# Patient Record
Sex: Male | Born: 1990 | Race: White | Hispanic: No | Marital: Single | Smoking: Former smoker
Health system: Southern US, Community
[De-identification: ages and names within clinical notes are randomized; demographics above are authoritative.]

## PROBLEM LIST (undated history)

## (undated) DIAGNOSIS — F32A Depression, unspecified: Secondary | ICD-10-CM

## (undated) DIAGNOSIS — F419 Anxiety disorder, unspecified: Secondary | ICD-10-CM

## (undated) DIAGNOSIS — F329 Major depressive disorder, single episode, unspecified: Secondary | ICD-10-CM

## (undated) DIAGNOSIS — F1910 Other psychoactive substance abuse, uncomplicated: Secondary | ICD-10-CM

## (undated) HISTORY — DX: Major depressive disorder, single episode, unspecified: F32.9

## (undated) HISTORY — DX: Anxiety disorder, unspecified: F41.9

## (undated) HISTORY — DX: Other psychoactive substance abuse, uncomplicated: F19.10

## (undated) HISTORY — DX: Depression, unspecified: F32.A

---
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
## 2010-05-30 ENCOUNTER — Emergency Department (HOSPITAL_COMMUNITY)
Admission: EM | Admit: 2010-05-30 | Discharge: 2010-05-30 | Payer: Self-pay | Source: Home / Self Care | Admitting: Family Medicine

## 2010-09-11 ENCOUNTER — Emergency Department (HOSPITAL_COMMUNITY): Payer: BC Managed Care – PPO

## 2010-09-11 ENCOUNTER — Inpatient Hospital Stay (HOSPITAL_COMMUNITY)
Admission: EM | Admit: 2010-09-11 | Discharge: 2010-09-18 | DRG: 584 | Disposition: A | Discharge status: Other Acute Inpatient Hospital | Payer: BC Managed Care – PPO | Attending: Pulmonary Disease | Admitting: Pulmonary Disease

## 2010-09-11 DIAGNOSIS — J9600 Acute respiratory failure, unspecified whether with hypoxia or hypercapnia: Secondary | ICD-10-CM | POA: Diagnosis present

## 2010-09-11 DIAGNOSIS — J690 Pneumonitis due to inhalation of food and vomit: Secondary | ICD-10-CM | POA: Diagnosis present

## 2010-09-11 DIAGNOSIS — R0902 Hypoxemia: Secondary | ICD-10-CM | POA: Diagnosis present

## 2010-09-11 DIAGNOSIS — N179 Acute kidney failure, unspecified: Secondary | ICD-10-CM

## 2010-09-11 DIAGNOSIS — T394X2A Poisoning by antirheumatics, not elsewhere classified, intentional self-harm, initial encounter: Secondary | ICD-10-CM | POA: Diagnosis present

## 2010-09-11 DIAGNOSIS — E46 Unspecified protein-calorie malnutrition: Secondary | ICD-10-CM | POA: Diagnosis present

## 2010-09-11 DIAGNOSIS — K7200 Acute and subacute hepatic failure without coma: Secondary | ICD-10-CM | POA: Diagnosis present

## 2010-09-11 DIAGNOSIS — N170 Acute kidney failure with tubular necrosis: Secondary | ICD-10-CM | POA: Diagnosis present

## 2010-09-11 DIAGNOSIS — T43502A Poisoning by unspecified antipsychotics and neuroleptics, intentional self-harm, initial encounter: Secondary | ICD-10-CM | POA: Diagnosis present

## 2010-09-11 DIAGNOSIS — R6520 Severe sepsis without septic shock: Secondary | ICD-10-CM

## 2010-09-11 DIAGNOSIS — T426X2A Poisoning by other antiepileptic and sedative-hypnotic drugs, intentional self-harm, initial encounter: Secondary | ICD-10-CM | POA: Diagnosis present

## 2010-09-11 DIAGNOSIS — M6282 Rhabdomyolysis: Secondary | ICD-10-CM | POA: Diagnosis present

## 2010-09-11 DIAGNOSIS — F3289 Other specified depressive episodes: Secondary | ICD-10-CM | POA: Diagnosis present

## 2010-09-11 DIAGNOSIS — Y92009 Unspecified place in unspecified non-institutional (private) residence as the place of occurrence of the external cause: Secondary | ICD-10-CM

## 2010-09-11 DIAGNOSIS — T424X4A Poisoning by benzodiazepines, undetermined, initial encounter: Secondary | ICD-10-CM

## 2010-09-11 DIAGNOSIS — A419 Sepsis, unspecified organism: Secondary | ICD-10-CM

## 2010-09-11 DIAGNOSIS — D696 Thrombocytopenia, unspecified: Secondary | ICD-10-CM | POA: Diagnosis present

## 2010-09-11 DIAGNOSIS — T4272XA Poisoning by unspecified antiepileptic and sedative-hypnotic drugs, intentional self-harm, initial encounter: Secondary | ICD-10-CM | POA: Diagnosis present

## 2010-09-11 DIAGNOSIS — T40601A Poisoning by unspecified narcotics, accidental (unintentional), initial encounter: Secondary | ICD-10-CM | POA: Diagnosis present

## 2010-09-11 DIAGNOSIS — E875 Hyperkalemia: Secondary | ICD-10-CM | POA: Diagnosis present

## 2010-09-11 DIAGNOSIS — F329 Major depressive disorder, single episode, unspecified: Secondary | ICD-10-CM | POA: Diagnosis present

## 2010-09-11 DIAGNOSIS — I498 Other specified cardiac arrhythmias: Secondary | ICD-10-CM | POA: Diagnosis present

## 2010-09-11 DIAGNOSIS — E870 Hyperosmolality and hypernatremia: Secondary | ICD-10-CM | POA: Diagnosis not present

## 2010-09-11 DIAGNOSIS — E874 Mixed disorder of acid-base balance: Secondary | ICD-10-CM | POA: Diagnosis present

## 2010-09-11 DIAGNOSIS — R6521 Severe sepsis with septic shock: Secondary | ICD-10-CM

## 2010-09-11 DIAGNOSIS — F17200 Nicotine dependence, unspecified, uncomplicated: Secondary | ICD-10-CM | POA: Diagnosis present

## 2010-09-11 DIAGNOSIS — R092 Respiratory arrest: Secondary | ICD-10-CM | POA: Diagnosis present

## 2010-09-11 DIAGNOSIS — T426X1A Poisoning by other antiepileptic and sedative-hypnotic drugs, accidental (unintentional), initial encounter: Secondary | ICD-10-CM | POA: Diagnosis present

## 2010-09-11 DIAGNOSIS — F988 Other specified behavioral and emotional disorders with onset usually occurring in childhood and adolescence: Secondary | ICD-10-CM | POA: Diagnosis present

## 2010-09-11 LAB — POCT I-STAT 3, ART BLOOD GAS (G3+)
Acid-base deficit: 4 mmol/L — ABNORMAL HIGH (ref 0.0–2.0)
Acid-base deficit: 7 mmol/L — ABNORMAL HIGH (ref 0.0–2.0)
Acid-base deficit: 8 mmol/L — ABNORMAL HIGH (ref 0.0–2.0)
Acid-base deficit: 6 mmol/L — ABNORMAL HIGH (ref 0.0–2.0)
Acid-base deficit: 5 mmol/L — ABNORMAL HIGH (ref 0.0–2.0)
Bicarbonate: 25.8 mEq/L — ABNORMAL HIGH (ref 20.0–24.0)
Bicarbonate: 23.3 mEq/L (ref 20.0–24.0)
Bicarbonate: 21.7 mEq/L (ref 20.0–24.0)
Bicarbonate: 21.3 mEq/L (ref 20.0–24.0)
O2 Saturation: 87 %
O2 Saturation: 88 %
O2 Saturation: 97 %
Patient temperature: 38.3
Patient temperature: 101.7
TCO2: 23 mmol/L (ref 0–100)
TCO2: 23 mmol/L (ref 0–100)
pCO2 arterial: 65.7 mmHg (ref 35.0–45.0)
pCO2 arterial: 45.6 mmHg — ABNORMAL HIGH (ref 35.0–45.0)
pCO2 arterial: 46.9 mmHg — ABNORMAL HIGH (ref 35.0–45.0)
pCO2 arterial: 51.7 mmHg — ABNORMAL HIGH (ref 35.0–45.0)
pCO2 arterial: 57.9 mmHg (ref 35.0–45.0)
pH, Arterial: 7.208 — ABNORMAL LOW (ref 7.350–7.450)
pH, Arterial: 7.246 — ABNORMAL LOW (ref 7.350–7.450)
pH, Arterial: 7.273 — ABNORMAL LOW (ref 7.350–7.450)
pO2, Arterial: 116 mmHg — ABNORMAL HIGH (ref 80.0–100.0)
pO2, Arterial: 64 mmHg — ABNORMAL LOW (ref 80.0–100.0)
pO2, Arterial: 75 mmHg — ABNORMAL LOW (ref 80.0–100.0)
pO2, Arterial: 110 mmHg — ABNORMAL HIGH (ref 80.0–100.0)
pO2, Arterial: 110 mmHg — ABNORMAL HIGH (ref 80.0–100.0)
pO2, Arterial: 151 mmHg — ABNORMAL HIGH (ref 80.0–100.0)

## 2010-09-11 LAB — DIFFERENTIAL
Basophils Absolute: 0 10*3/uL (ref 0.0–0.1)
Eosinophils Absolute: 0 10*3/uL (ref 0.0–0.7)
Lymphocytes Relative: 5 % — ABNORMAL LOW (ref 12–46)
Lymphs Abs: 1.3 10*3/uL (ref 0.7–4.0)
Monocytes Relative: 17 % — ABNORMAL HIGH (ref 3–12)
Smear Review: ADEQUATE
WBC Morphology: INCREASED

## 2010-09-11 LAB — COMPREHENSIVE METABOLIC PANEL
AST: 326 U/L — ABNORMAL HIGH (ref 0–37)
CO2: 20 mEq/L (ref 19–32)
Calcium: 8.1 mg/dL — ABNORMAL LOW (ref 8.4–10.5)
Chloride: 105 mEq/L (ref 96–112)
Creatinine, Ser: 2.25 mg/dL — ABNORMAL HIGH (ref 0.4–1.5)
GFR calc Af Amer: 46 mL/min — ABNORMAL LOW (ref >60)
GFR calc non Af Amer: 38 mL/min — ABNORMAL LOW (ref >60)
Glucose, Bld: 363 mg/dL — ABNORMAL HIGH (ref 70–99)
Total Bilirubin: 0.7 mg/dL (ref 0.3–1.2)

## 2010-09-11 LAB — D-DIMER, QUANTITATIVE: D-Dimer, Quant: 1.14 ug/mL-FEU — ABNORMAL HIGH (ref 0.00–0.48)

## 2010-09-11 LAB — URINE MICROSCOPIC-ADD ON

## 2010-09-11 LAB — BASIC METABOLIC PANEL
CO2: 22 mEq/L (ref 19–32)
Calcium: 7 mg/dL — ABNORMAL LOW (ref 8.4–10.5)
Calcium: 7.3 mg/dL — ABNORMAL LOW (ref 8.4–10.5)
Creatinine, Ser: 1.87 mg/dL — ABNORMAL HIGH (ref 0.4–1.5)
Creatinine, Ser: 1.8 mg/dL — ABNORMAL HIGH (ref 0.4–1.5)
GFR calc Af Amer: 59 mL/min — ABNORMAL LOW (ref >60)
GFR calc non Af Amer: 47 mL/min — ABNORMAL LOW (ref >60)
GFR calc non Af Amer: 49 mL/min — ABNORMAL LOW (ref >60)
Glucose, Bld: 252 mg/dL — ABNORMAL HIGH (ref 70–99)
Glucose, Bld: 148 mg/dL — ABNORMAL HIGH (ref 70–99)
Sodium: 137 mEq/L (ref 135–145)
Sodium: 139 mEq/L (ref 135–145)

## 2010-09-11 LAB — STREP PNEUMONIAE URINARY ANTIGEN: Strep Pneumo Urinary Antigen: NEGATIVE

## 2010-09-11 LAB — CBC
Hemoglobin: 16.6 g/dL (ref 13.0–17.0)
MCHC: 33.6 g/dL (ref 30.0–36.0)
Platelets: 231 10*3/uL (ref 150–400)
RBC: 5.62 MIL/uL (ref 4.22–5.81)

## 2010-09-11 LAB — BLOOD GAS, ARTERIAL
Acid-base deficit: 6.3 mmol/L — ABNORMAL HIGH (ref 0.0–2.0)
Drawn by: 332341
MECHVT: 520 mL
O2 Saturation: 90.5 %
Patient temperature: 98.6
RATE: 35 resp/min
TCO2: 20.7 mmol/L (ref 0–100)

## 2010-09-11 LAB — SALICYLATE LEVEL: Salicylate Lvl: <4 mg/dL (ref 2.8–20.0)

## 2010-09-11 LAB — GLUCOSE, CAPILLARY: Glucose-Capillary: 151 mg/dL — ABNORMAL HIGH (ref 70–99)

## 2010-09-11 LAB — CARDIAC PANEL(CRET KIN+CKTOT+MB+TROPI)
CK, MB: 137.7 ng/mL (ref 0.3–4.0)
Relative Index: 0.4 (ref 0.0–2.5)
Total CK: 35755 U/L — ABNORMAL HIGH (ref 7–232)

## 2010-09-11 LAB — CK TOTAL AND CKMB (NOT AT ARMC)
Relative Index: 0.3 (ref 0.0–2.5)
Total CK: 26204 U/L — ABNORMAL HIGH (ref 7–232)

## 2010-09-11 LAB — POCT I-STAT, CHEM 8
Creatinine, Ser: 2.4 mg/dL — ABNORMAL HIGH (ref 0.4–1.5)
Glucose, Bld: 83 mg/dL (ref 70–99)
HCT: 53 % — ABNORMAL HIGH (ref 39.0–52.0)
Potassium: 6.9 mEq/L (ref 3.5–5.1)

## 2010-09-11 LAB — URINALYSIS, ROUTINE W REFLEX MICROSCOPIC
Bilirubin Urine: NEGATIVE
Glucose, UA: NEGATIVE mg/dL
Ketones, ur: NEGATIVE mg/dL
Protein, ur: NEGATIVE mg/dL
Urobilinogen, UA: 1 mg/dL (ref 0.0–1.0)

## 2010-09-11 LAB — RAPID URINE DRUG SCREEN, HOSP PERFORMED
Amphetamines: NOT DETECTED
Amphetamines: NOT DETECTED
Barbiturates: NOT DETECTED
Benzodiazepines: POSITIVE — AB
Cocaine: NOT DETECTED
Opiates: NOT DETECTED
Opiates: NOT DETECTED
Tetrahydrocannabinol: NOT DETECTED

## 2010-09-11 LAB — TYPE AND SCREEN: ABO/RH(D): B POS

## 2010-09-11 LAB — TROPONIN I: Troponin I: 0.73 ng/mL (ref 0.00–0.06)

## 2010-09-11 LAB — PROTIME-INR
INR: 1.22 (ref 0.00–1.49)
Prothrombin Time: 15.6 seconds — ABNORMAL HIGH (ref 11.6–15.2)

## 2010-09-11 LAB — LACTIC ACID, PLASMA: Lactic Acid, Venous: 2.9 mmol/L — ABNORMAL HIGH (ref 0.5–2.2)

## 2010-09-12 ENCOUNTER — Inpatient Hospital Stay (HOSPITAL_COMMUNITY): Payer: BC Managed Care – PPO

## 2010-09-12 DIAGNOSIS — N179 Acute kidney failure, unspecified: Secondary | ICD-10-CM

## 2010-09-12 LAB — GLUCOSE, CAPILLARY
Glucose-Capillary: 136 mg/dL — ABNORMAL HIGH (ref 70–99)
Glucose-Capillary: 140 mg/dL — ABNORMAL HIGH (ref 70–99)
Glucose-Capillary: 163 mg/dL — ABNORMAL HIGH (ref 70–99)
Glucose-Capillary: 94 mg/dL (ref 70–99)

## 2010-09-12 LAB — POCT I-STAT 3, ART BLOOD GAS (G3+)
Acid-Base Excess: 1 mmol/L (ref 0.0–2.0)
Acid-Base Excess: 4 mmol/L — ABNORMAL HIGH (ref 0.0–2.0)
Acid-base deficit: 1 mmol/L (ref 0.0–2.0)
Acid-base deficit: 4 mmol/L — ABNORMAL HIGH (ref 0.0–2.0)
Bicarbonate: 21.6 mEq/L (ref 20.0–24.0)
Bicarbonate: 23.7 mEq/L (ref 20.0–24.0)
Bicarbonate: 28 mEq/L — ABNORMAL HIGH (ref 20.0–24.0)
Bicarbonate: 29.2 mEq/L — ABNORMAL HIGH (ref 20.0–24.0)
O2 Saturation: 100 %
O2 Saturation: 100 %
O2 Saturation: 93 %
Patient temperature: 98.4
Patient temperature: 99
Patient temperature: 99
Patient temperature: 99.1
Patient temperature: 99.5
Patient temperature: 99.5
TCO2: 23 mmol/L (ref 0–100)
TCO2: 29 mmol/L (ref 0–100)
TCO2: 31 mmol/L (ref 0–100)
TCO2: 33 mmol/L (ref 0–100)
pCO2 arterial: 41.9 mmHg (ref 35.0–45.0)
pCO2 arterial: 63.6 mmHg (ref 35.0–45.0)
pCO2 arterial: 57.8 mmHg (ref 35.0–45.0)
pH, Arterial: 7.379 (ref 7.350–7.450)
pH, Arterial: 7.424 (ref 7.350–7.450)
pH, Arterial: 7.388 (ref 7.350–7.450)
pH, Arterial: 7.271 — ABNORMAL LOW (ref 7.350–7.450)
pH, Arterial: 7.329 — ABNORMAL LOW (ref 7.350–7.450)
pO2, Arterial: 122 mmHg — ABNORMAL HIGH (ref 80.0–100.0)
pO2, Arterial: 181 mmHg — ABNORMAL HIGH (ref 80.0–100.0)
pO2, Arterial: 249 mmHg — ABNORMAL HIGH (ref 80.0–100.0)
pO2, Arterial: 75 mmHg — ABNORMAL LOW (ref 80.0–100.0)

## 2010-09-12 LAB — CBC
HCT: 39.9 % (ref 39.0–52.0)
HCT: 37.2 % — ABNORMAL LOW (ref 39.0–52.0)
MCH: 29.9 pg (ref 26.0–34.0)
MCHC: 34.6 g/dL (ref 30.0–36.0)
MCHC: 34.1 g/dL (ref 30.0–36.0)
MCV: 87.5 fL (ref 78.0–100.0)
Platelets: 120 10*3/uL — ABNORMAL LOW (ref 150–400)
Platelets: 105 10*3/uL — ABNORMAL LOW (ref 150–400)
RDW: 12.7 % (ref 11.5–15.5)
RDW: 12.7 % (ref 11.5–15.5)
WBC: 17.7 10*3/uL — ABNORMAL HIGH (ref 4.0–10.5)

## 2010-09-12 LAB — HEPATIC FUNCTION PANEL
ALT: 313 U/L — ABNORMAL HIGH (ref 0–53)
Albumin: 2.6 g/dL — ABNORMAL LOW (ref 3.5–5.2)
Alkaline Phosphatase: 46 U/L (ref 39–117)

## 2010-09-12 LAB — BASIC METABOLIC PANEL
BUN: 17 mg/dL (ref 6–23)
BUN: 18 mg/dL (ref 6–23)
CO2: 28 mEq/L (ref 19–32)
CO2: 28 mEq/L (ref 19–32)
Calcium: 7.8 mg/dL — ABNORMAL LOW (ref 8.4–10.5)
Calcium: 7.6 mg/dL — ABNORMAL LOW (ref 8.4–10.5)
Chloride: 109 mEq/L (ref 96–112)
Creatinine, Ser: 1.13 mg/dL (ref 0.4–1.5)
Creatinine, Ser: 1.16 mg/dL (ref 0.4–1.5)
GFR calc Af Amer: >60 mL/min (ref >60)
GFR calc non Af Amer: >60 mL/min (ref >60)
GFR calc non Af Amer: >60 mL/min (ref >60)
Glucose, Bld: 169 mg/dL — ABNORMAL HIGH (ref 70–99)
Glucose, Bld: 104 mg/dL — ABNORMAL HIGH (ref 70–99)
Potassium: 4 mEq/L (ref 3.5–5.1)
Potassium: 3.6 mEq/L (ref 3.5–5.1)
Sodium: 138 mEq/L (ref 135–145)

## 2010-09-12 LAB — URINE CULTURE
Colony Count: NO GROWTH
Culture: NO GROWTH

## 2010-09-12 LAB — CARDIAC PANEL(CRET KIN+CKTOT+MB+TROPI)
CK, MB: 138.9 ng/mL (ref 0.3–4.0)
Relative Index: 0.4 (ref 0.0–2.5)
Relative Index: 0.3 (ref 0.0–2.5)
Total CK: 29324 U/L — ABNORMAL HIGH (ref 7–232)
Troponin I: 2.83 ng/mL (ref 0.00–0.06)

## 2010-09-12 LAB — CARBOXYHEMOGLOBIN
Carboxyhemoglobin: 1.2 % (ref 0.5–1.5)
O2 Saturation: 72.9 %

## 2010-09-13 ENCOUNTER — Inpatient Hospital Stay (HOSPITAL_COMMUNITY): Payer: BC Managed Care – PPO

## 2010-09-13 DIAGNOSIS — R069 Unspecified abnormalities of breathing: Secondary | ICD-10-CM

## 2010-09-13 DIAGNOSIS — J690 Pneumonitis due to inhalation of food and vomit: Secondary | ICD-10-CM

## 2010-09-13 LAB — CARDIAC PANEL(CRET KIN+CKTOT+MB+TROPI)
CK, MB: 55 ng/mL (ref 0.3–4.0)
CK, MB: 68.8 ng/mL (ref 0.3–4.0)
Relative Index: 0.2 (ref 0.0–2.5)
Total CK: 31382 U/L — ABNORMAL HIGH (ref 7–232)
Total CK: 21202 U/L — ABNORMAL HIGH (ref 7–232)
Troponin I: 2.36 ng/mL (ref 0.00–0.06)

## 2010-09-13 LAB — POCT I-STAT 3, ART BLOOD GAS (G3+)
Bicarbonate: 28.4 mEq/L — ABNORMAL HIGH (ref 20.0–24.0)
Bicarbonate: 29.6 mEq/L — ABNORMAL HIGH (ref 20.0–24.0)
O2 Saturation: 97 %
O2 Saturation: 99 %
TCO2: 30 mmol/L (ref 0–100)
TCO2: 31 mmol/L (ref 0–100)
TCO2: 31 mmol/L (ref 0–100)
pCO2 arterial: 55.6 mmHg — ABNORMAL HIGH (ref 35.0–45.0)
pCO2 arterial: 58.1 mmHg (ref 35.0–45.0)
pH, Arterial: 7.317 — ABNORMAL LOW (ref 7.350–7.450)
pH, Arterial: 7.455 — ABNORMAL HIGH (ref 7.350–7.450)
pO2, Arterial: 198 mmHg — ABNORMAL HIGH (ref 80.0–100.0)
pO2, Arterial: 129 mmHg — ABNORMAL HIGH (ref 80.0–100.0)

## 2010-09-13 LAB — COMPREHENSIVE METABOLIC PANEL
AST: 843 U/L — ABNORMAL HIGH (ref 0–37)
BUN: 15 mg/dL (ref 6–23)
CO2: 28 mEq/L (ref 19–32)
Chloride: 109 mEq/L (ref 96–112)
Creatinine, Ser: 0.95 mg/dL (ref 0.4–1.5)
GFR calc Af Amer: >60 mL/min (ref >60)
GFR calc non Af Amer: >60 mL/min (ref >60)
Glucose, Bld: 119 mg/dL — ABNORMAL HIGH (ref 70–99)
Total Bilirubin: 0.6 mg/dL (ref 0.3–1.2)

## 2010-09-13 LAB — GLUCOSE, CAPILLARY
Glucose-Capillary: 117 mg/dL — ABNORMAL HIGH (ref 70–99)
Glucose-Capillary: 134 mg/dL — ABNORMAL HIGH (ref 70–99)

## 2010-09-13 LAB — PROTIME-INR
INR: 1.41 (ref 0.00–1.49)
Prothrombin Time: 17.5 seconds — ABNORMAL HIGH (ref 11.6–15.2)

## 2010-09-13 LAB — CBC
HCT: 31.7 % — ABNORMAL LOW (ref 39.0–52.0)
Hemoglobin: 11 g/dL — ABNORMAL LOW (ref 13.0–17.0)
RBC: 3.63 MIL/uL — ABNORMAL LOW (ref 4.22–5.81)
WBC: 11.1 10*3/uL — ABNORMAL HIGH (ref 4.0–10.5)

## 2010-09-13 LAB — HIV ANTIBODY (ROUTINE TESTING W REFLEX): HIV: NONREACTIVE

## 2010-09-14 ENCOUNTER — Inpatient Hospital Stay (HOSPITAL_COMMUNITY): Payer: BC Managed Care – PPO

## 2010-09-14 ENCOUNTER — Other Ambulatory Visit (HOSPITAL_COMMUNITY): Payer: BLUE CROSS/BLUE SHIELD

## 2010-09-14 DIAGNOSIS — M629 Disorder of muscle, unspecified: Secondary | ICD-10-CM

## 2010-09-14 LAB — POCT I-STAT 3, ART BLOOD GAS (G3+)
Bicarbonate: 36.1 mEq/L — ABNORMAL HIGH (ref 20.0–24.0)
Patient temperature: 98.6
Patient temperature: 37.3
TCO2: 38 mmol/L (ref 0–100)
TCO2: 38 mmol/L (ref 0–100)
pCO2 arterial: 58.5 mmHg (ref 35.0–45.0)
pCO2 arterial: 53.8 mmHg — ABNORMAL HIGH (ref 35.0–45.0)
pH, Arterial: 7.404 (ref 7.350–7.450)
pH, Arterial: 7.436 (ref 7.350–7.450)
pO2, Arterial: 84 mmHg (ref 80.0–100.0)

## 2010-09-14 LAB — CBC
HCT: 33.9 % — ABNORMAL LOW (ref 39.0–52.0)
Hemoglobin: 11.3 g/dL — ABNORMAL LOW (ref 13.0–17.0)
RBC: 3.84 MIL/uL — ABNORMAL LOW (ref 4.22–5.81)
WBC: 12.3 10*3/uL — ABNORMAL HIGH (ref 4.0–10.5)

## 2010-09-14 LAB — CARDIAC PANEL(CRET KIN+CKTOT+MB+TROPI)
CK, MB: 12.1 ng/mL (ref 0.3–4.0)
CK, MB: 7.6 ng/mL (ref 0.3–4.0)
Relative Index: 0 (ref 0.0–2.5)
Total CK: 14222 U/L — ABNORMAL HIGH (ref 7–232)
Total CK: 16271 U/L — ABNORMAL HIGH (ref 7–232)
Troponin I: 0.78 ng/mL (ref 0.00–0.06)
Troponin I: 0.75 ng/mL (ref 0.00–0.06)

## 2010-09-14 LAB — GLUCOSE, CAPILLARY
Glucose-Capillary: 161 mg/dL — ABNORMAL HIGH (ref 70–99)
Glucose-Capillary: 156 mg/dL — ABNORMAL HIGH (ref 70–99)

## 2010-09-14 LAB — COMPREHENSIVE METABOLIC PANEL
AST: 662 U/L — ABNORMAL HIGH (ref 0–37)
Albumin: 2.5 g/dL — ABNORMAL LOW (ref 3.5–5.2)
BUN: 20 mg/dL (ref 6–23)
CO2: 33 mEq/L — ABNORMAL HIGH (ref 19–32)
Calcium: 7.9 mg/dL — ABNORMAL LOW (ref 8.4–10.5)
Creatinine, Ser: 1.04 mg/dL (ref 0.4–1.5)
GFR calc Af Amer: >60 mL/min (ref >60)
GFR calc non Af Amer: >60 mL/min (ref >60)
Total Bilirubin: 0.5 mg/dL (ref 0.3–1.2)

## 2010-09-14 LAB — PROTIME-INR
INR: 1.23 (ref 0.00–1.49)
Prothrombin Time: 15.7 seconds — ABNORMAL HIGH (ref 11.6–15.2)

## 2010-09-14 LAB — HEPATITIS PANEL, ACUTE
Hep A IgM: NEGATIVE
Hep B C IgM: NEGATIVE

## 2010-09-14 LAB — CULTURE, BAL-QUANTITATIVE W GRAM STAIN: Colony Count: 2000

## 2010-09-14 LAB — PHOSPHORUS: Phosphorus: 1.9 mg/dL — ABNORMAL LOW (ref 2.3–4.6)

## 2010-09-14 LAB — AMYLASE: Amylase: 342 U/L — ABNORMAL HIGH (ref 0–105)

## 2010-09-14 LAB — APTT: aPTT: 25 seconds (ref 24–37)

## 2010-09-15 ENCOUNTER — Inpatient Hospital Stay (HOSPITAL_COMMUNITY): Payer: BC Managed Care – PPO

## 2010-09-15 LAB — COMPREHENSIVE METABOLIC PANEL
AST: 433 U/L — ABNORMAL HIGH (ref 0–37)
Albumin: 2.7 g/dL — ABNORMAL LOW (ref 3.5–5.2)
Chloride: 105 mEq/L (ref 96–112)
Creatinine, Ser: 0.96 mg/dL (ref 0.4–1.5)
GFR calc Af Amer: >60 mL/min (ref >60)
Total Bilirubin: 0.9 mg/dL (ref 0.3–1.2)

## 2010-09-15 LAB — POCT I-STAT 3, ART BLOOD GAS (G3+)
Acid-Base Excess: 9 mmol/L — ABNORMAL HIGH (ref 0.0–2.0)
Acid-Base Excess: 8 mmol/L — ABNORMAL HIGH (ref 0.0–2.0)
Acid-Base Excess: 7 mmol/L — ABNORMAL HIGH (ref 0.0–2.0)
Bicarbonate: 31.9 mEq/L — ABNORMAL HIGH (ref 20.0–24.0)
O2 Saturation: 85 %
O2 Saturation: 97 %
Patient temperature: 98.7
pCO2 arterial: 51.5 mmHg — ABNORMAL HIGH (ref 35.0–45.0)
pO2, Arterial: 93 mmHg (ref 80.0–100.0)

## 2010-09-15 LAB — CBC
MCH: 29.1 pg (ref 26.0–34.0)
Platelets: 129 10*3/uL — ABNORMAL LOW (ref 150–400)
RBC: 3.95 MIL/uL — ABNORMAL LOW (ref 4.22–5.81)
WBC: 16.4 10*3/uL — ABNORMAL HIGH (ref 4.0–10.5)

## 2010-09-15 LAB — PROTIME-INR: Prothrombin Time: 15.1 seconds (ref 11.6–15.2)

## 2010-09-15 LAB — PHOSPHORUS: Phosphorus: 2.5 mg/dL (ref 2.3–4.6)

## 2010-09-15 LAB — CULTURE, BAL-QUANTITATIVE W GRAM STAIN

## 2010-09-15 LAB — GLUCOSE, CAPILLARY
Glucose-Capillary: 140 mg/dL — ABNORMAL HIGH (ref 70–99)
Glucose-Capillary: 134 mg/dL — ABNORMAL HIGH (ref 70–99)
Glucose-Capillary: 156 mg/dL — ABNORMAL HIGH (ref 70–99)

## 2010-09-15 LAB — APTT: aPTT: 25 seconds (ref 24–37)

## 2010-09-16 ENCOUNTER — Inpatient Hospital Stay (HOSPITAL_COMMUNITY): Payer: BC Managed Care – PPO

## 2010-09-16 DIAGNOSIS — T424X4A Poisoning by benzodiazepines, undetermined, initial encounter: Secondary | ICD-10-CM

## 2010-09-16 DIAGNOSIS — A419 Sepsis, unspecified organism: Secondary | ICD-10-CM

## 2010-09-16 DIAGNOSIS — M629 Disorder of muscle, unspecified: Secondary | ICD-10-CM

## 2010-09-16 DIAGNOSIS — J9600 Acute respiratory failure, unspecified whether with hypoxia or hypercapnia: Secondary | ICD-10-CM

## 2010-09-16 DIAGNOSIS — R6521 Severe sepsis with septic shock: Secondary | ICD-10-CM

## 2010-09-16 LAB — COMPREHENSIVE METABOLIC PANEL
ALT: 335 U/L — ABNORMAL HIGH (ref 0–53)
Calcium: 8.1 mg/dL — ABNORMAL LOW (ref 8.4–10.5)
Creatinine, Ser: 0.98 mg/dL (ref 0.4–1.5)
GFR calc Af Amer: >60 mL/min (ref >60)
GFR calc non Af Amer: >60 mL/min (ref >60)
Glucose, Bld: 115 mg/dL — ABNORMAL HIGH (ref 70–99)
Sodium: 141 mEq/L (ref 135–145)
Total Protein: 5 g/dL — ABNORMAL LOW (ref 6.0–8.3)

## 2010-09-16 LAB — GLUCOSE, CAPILLARY
Glucose-Capillary: 122 mg/dL — ABNORMAL HIGH (ref 70–99)
Glucose-Capillary: 133 mg/dL — ABNORMAL HIGH (ref 70–99)
Glucose-Capillary: 128 mg/dL — ABNORMAL HIGH (ref 70–99)
Glucose-Capillary: 112 mg/dL — ABNORMAL HIGH (ref 70–99)
Glucose-Capillary: 86 mg/dL (ref 70–99)

## 2010-09-16 LAB — CBC
HCT: 34 % — ABNORMAL LOW (ref 39.0–52.0)
MCH: 29.4 pg (ref 26.0–34.0)
MCHC: 33.8 g/dL (ref 30.0–36.0)
RDW: 12.2 % (ref 11.5–15.5)

## 2010-09-16 LAB — PHOSPHORUS: Phosphorus: 3.3 mg/dL (ref 2.3–4.6)

## 2010-09-16 LAB — TSH: TSH: 5.046 u[IU]/mL — ABNORMAL HIGH (ref 0.350–4.500)

## 2010-09-16 LAB — C-REACTIVE PROTEIN: CRP: 4.4 mg/dL — ABNORMAL HIGH (ref <0.6)

## 2010-09-16 LAB — MAGNESIUM: Magnesium: 2.4 mg/dL (ref 1.5–2.5)

## 2010-09-17 ENCOUNTER — Inpatient Hospital Stay (HOSPITAL_COMMUNITY): Payer: BC Managed Care – PPO

## 2010-09-17 DIAGNOSIS — F4310 Post-traumatic stress disorder, unspecified: Secondary | ICD-10-CM

## 2010-09-17 DIAGNOSIS — N179 Acute kidney failure, unspecified: Secondary | ICD-10-CM

## 2010-09-17 LAB — CULTURE, BLOOD (ROUTINE X 2)
Culture  Setup Time: 201204102249
Culture: NO GROWTH
Culture: NO GROWTH

## 2010-09-17 LAB — GLUCOSE, CAPILLARY
Glucose-Capillary: 81 mg/dL (ref 70–99)
Glucose-Capillary: 90 mg/dL (ref 70–99)
Glucose-Capillary: 88 mg/dL (ref 70–99)

## 2010-09-17 LAB — MAGNESIUM: Magnesium: 1.8 mg/dL (ref 1.5–2.5)

## 2010-09-17 LAB — BASIC METABOLIC PANEL
Chloride: 106 mEq/L (ref 96–112)
GFR calc non Af Amer: >60 mL/min (ref >60)
Potassium: 3.5 mEq/L (ref 3.5–5.1)
Sodium: 137 mEq/L (ref 135–145)

## 2010-09-17 LAB — ANA: Anti Nuclear Antibody(ANA): NEGATIVE

## 2010-09-17 LAB — B. BURGDORFI ANTIBODIES: B burgdorferi Ab IgG+IgM: 0.4 {ISR}

## 2010-09-17 LAB — PHOSPHORUS: Phosphorus: 3.2 mg/dL (ref 2.3–4.6)

## 2010-09-17 NOTE — H&P (Signed)
NAME:  Sean, Ruiz NO.:  [id number]  MEDICAL RECORD NO.:  [id number]           PATIENT TYPE:  E  LOCATION:  [facility]                         FACILITY:  [facility]  PHYSICIAN:  Oretha Milch, MD      DATE OF BIRTH:  30-May-1991  DATE OF ADMISSION:  09/11/2010 DATE OF DISCHARGE:                             HISTORY & PHYSICAL   INDICATION FOR ADMISSION:  Acute respiratory failure due to possible overdose.  HISTORY OF PRESENT ILLNESS:  Sean Ruiz is an 20 year old Caucasian male who is brought in by EMS today after his mother found him at 8 a.m. ashen and unresponsive.  She was brought into his room because his alarm clock was going off.  The history is obtained after speaking to the ED attending and from his mother.  He does carry a history of depression for 2 years and is on Prozac, Wellbutrin, and clonazepam.  As per his mother, he may have taken some oxymorphone or OxyContin as related to her by one of his friends yesterday late afternoon.  He came home about 7 p.m. appearing his usual state of health and mood and spent some time on face times turning with a friend in [place].  Went to bed around 9 o'clock and mom could hear loud snoring as is usual for him.  He apparently woke up around close to midnight and had some sweetie to drink and went back to bed, that was when he was last seen until 8 a.m. the following morning.  EMS gave him Narcan x2 with good response.  On arrival to the emergency room per Dr. Faylene Million he could not protect his airway and was accordingly intubated with etomidate succinylcholine. Subsequently Versed and fentanyl drips were started and due to being combative, he required a bolus of Versed and fentanyl.  Labs suggested acute renal failure with a creatinine of 2.4 and a K or 6.9 and hence he was treated with 10 units of insulin D50, 1 amp of bicarbonate and calcium.  EKG showed sinus tachycardia with a rate of 125 per minute and incomplete right  bundle-branch block pattern.  Chest x-ray suggested left lower lobe infiltrate.  We are asked to resume further care from this point on.  PAST MEDICAL HISTORY:  Depression as stated above for which he sees Dr. Ander Slade and a counselor, ADHD as a child requiring Vyvanse.  PAST SURGICAL HISTORY:  None.  ALLERGIES:  None known.  CURRENT MEDICATIONS: 1. Prozac 40 mg. 2. Wellbutrin unknown dose. 3. Clonazepam 0.5 to 1 mg for anxiety.  This bottle was found empty in     his room. 4. Doxycycline for acne.  FAMILY HISTORY:  Both parents are healthy.  Father is an anesthesiologist at [employer]  SOCIAL HISTORY:  He smokes few cigarettes a day, history of drug abuse, probably narcotics.  No history is prescription drug abuse.  No history of IV drug use.  REVIEW OF SYSTEMS:  Unobtainable at the current time.  LABORATORY DATA:  Hemoglobin 18, hematocrit 53.  Sodium 137, potassium 6.9, chloride 104, glucose 83, BUN/creatinine 31/2.4.  Protime INR was 1.2, PTT  was 28.  UA showed nitrite negative, leuk esterase negative, 7- 10 red cells, 0-2 white cells.  Arterial blood gas was 7.21/66/116/97%. Urine drug screen was positive for benzos and negative for opiates.  WBC count was 25.8, platelet 231, 78% neutrophils, 5% lymphocytes, more than 20% bands.  PHYSICAL EXAMINATION:  GENERAL:  Well-built and well-nourished male in no acute distress. VITAL SIGNS:  T-max 99.9, heart rate 140 sinus on monitor, blood pressure 128/74, respirations 34 per minute ventilator settings PR 86 at a rate of 20, 80% PEEP of 5. HEENT:  Pupils 5 mm reactive to light. NECK:  Supple.  No JVD.  No lymphadenopathy. CVS:  S1 and S2 normal. CHEST:  Bilateral air entry.  ET tube at 23 cm. ABDOMEN:  Soft, nontender. NEUROLOGIC:  Sedated, unresponsive, was moving all fours when he was combative. EXTREMITIES:  No edema.  Lab data and imaging studies and EKG as described above.  Head CT showed no evidence of mass shift  or bleed.  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  IMPRESSION: 1. Acute respiratory failure due to likely benzodiazepine overdose. 2. Aspiration pneumonia. 3. Acute renal failure with hyperkalemia. 4. Acute myocardial injury with positive troponin 0.7.  RECOMMEND: 1. He appears to be hemodynamically well and resuscitated with 1.5 L     of normal saline.  Another 500 of normal saline will be given and     then fluids will be changed to D5 NS at 150 an hour with boluses     given as required. 2. Given that he is combative and agitated, Versed and fentanyl drips     have been started since Narcan was administered, I would prefer to     titrate Versed rather than fentanyl.  Dexmedetomidine can be added     at a dose of 0.8 mcg/kg/hour if he will hemodynamically tolerate     this.  This may enable [place] to lower rates of the other drips. 3. Blood cultures will be obtained and empiric Unasyn will be given     for aspiration pneumonia. 4. Some urine output has been obtained.  BMET will be repeated in 2     hours.  I do not see any EKG changes or hyperkalemia, although the     potassium has not come down.  We will give him Kayexalate and     another round of bicarb and calcium gluconate and insulin D50.  The     hyperkalemia on presentation may have been related to the acidosis     and hopefully this will improve as the acidosis is corrected.     Acidosis seems to be respiratory.  Urine electrolytes will be     obtained.  Renal failure is likely prerenal. 5. Blood loss.  Since head CT is negative, Lovenox will be used for     DVT prophylaxis.  IV Protonix will be used for GI prophylaxis.  This plan was discussed with guarded prognosis given to the patient's mother primarily because neurologic prognostication is difficult at this time; however, I am cautiously optimistic about a good neurological outcome. Total CC time x 90 mins     Oretha Milch, MD     RVA/MEDQ   D:  09/11/2010  T:  09/11/2010  Job:  [id number]  Electronically Signed by Cyril Mourning MD on 09/17/2010 08:42:23 AM

## 2010-09-18 ENCOUNTER — Inpatient Hospital Stay (HOSPITAL_COMMUNITY)
Admission: AD | Admit: 2010-09-18 | Discharge: 2010-09-21 | DRG: 430 | Disposition: A | Discharge status: Home / Self Care | Payer: BC Managed Care – PPO | Source: Ambulatory Visit | Attending: Psychiatry | Admitting: Psychiatry

## 2010-09-18 DIAGNOSIS — T424X4A Poisoning by benzodiazepines, undetermined, initial encounter: Secondary | ICD-10-CM

## 2010-09-18 DIAGNOSIS — F1020 Alcohol dependence, uncomplicated: Secondary | ICD-10-CM

## 2010-09-18 DIAGNOSIS — F332 Major depressive disorder, recurrent severe without psychotic features: Principal | ICD-10-CM

## 2010-09-18 DIAGNOSIS — F17200 Nicotine dependence, unspecified, uncomplicated: Secondary | ICD-10-CM

## 2010-09-18 DIAGNOSIS — F411 Generalized anxiety disorder: Secondary | ICD-10-CM

## 2010-09-18 DIAGNOSIS — F1920 Other psychoactive substance dependence, uncomplicated: Secondary | ICD-10-CM

## 2010-09-18 DIAGNOSIS — T43502A Poisoning by unspecified antipsychotics and neuroleptics, intentional self-harm, initial encounter: Secondary | ICD-10-CM

## 2010-09-18 LAB — ANTI-DNA ANTIBODY, DOUBLE-STRANDED: ds DNA Ab: 1 IU/mL (ref <30)

## 2010-09-18 NOTE — Consult Note (Signed)
NAME:  Sean Ruiz, Sean Ruiz NO.:  [id number]  MEDICAL RECORD NO.:  [id number]           PATIENT TYPE:  I  LOCATION:  2116                         FACILITY:  [facility]  PHYSICIAN:  Eulogio Ditch, MD DATE OF BIRTH:  1991-04-01  DATE OF CONSULTATION: DATE OF DISCHARGE:                                CONSULTATION   REASON FOR CONSULTATION:  Questionable history of suicide attempt.  HISTORY OF PRESENT ILLNESS:  A 20 year old male who was seen in the ICU. I also reviewed the patient's medical records.  The patient was admitted on September 11, 2010 and was intubated, extubated and then again intubated. The patient has a long history of depression.  He is followed in the outpatient setting by psychiatrist and also getting counseling.  The patient is on Prozac and Wellbutrin for almost one and half year.  The patient also abusing alcohol and buying pain pills  from the street. The patient was prescribed Klonopin by his psychiatrist for anxiety. Before being admitted to the hospital, the patient told me that he took whole bottle of Klonopin.  Initially, he was telling he just took 4-5 pills.  The patient was not knowing under what circumstances he took those pills.  He denied that it was suicide attempt.  He did not planned for it.  But was unable to tell me that under what circumstances he took so many pills.  He denied any stress, any family conflicts.  The patient is logical and goal directed during the interview, but was not forthcoming in providing the history.  His affect is constricted.  The patient was agreeing that he need help for his abuse of the alcohol. The patient drinks almost 1 liter of alcohol every other day, he drinks in the evening and he drinks a whole liter in 4 to 5 hours.  The patient has no history of psych hospitalization or suicide attempt in the past.  MENTAL STATUS EXAM:  Calm, cooperative during the interview.  No abnormal movements noticed.   No agitation noted during the interview. Fair eye contact.  Mood depressed.  Affect constricted.  Thought process logical and goal directed, but was not forthcoming in providing the history.  Denies suicidal or homicidal ideations, is not delusional. Not internally preoccupied.  Denies hearing any voices.  Cognition alert, awake, oriented x3.  Memory immediate.  His remote is fair. Attention and concentration poor.  Abstraction ability fair.  Insight and judgment poor.  DIAGNOSES:  Axis I:  Polysubstance abuse, major depressive disorder, recurrent type, anxiety disorder, NOS. Axis II:  Deferred. Axis III:  See medical notes. Axis IV:  Chronic substance abuse and history of depression. Axis V:  30-40.  RECOMMENDATIONS: 1. I discussed with the mother of the patient that it will be better     for the patient to get admitted to the rehab because of his drug     abuse and once he gets the rehab, then he can be followed in the     outpatient setting for his depression. 2. I also explained to the patient that he has to stay away from the  alcohol and drugs, otherwise medications for depression will not     work in his system. 3. The patient agrees to all the options at this time. 4. We will discuss with the patient's father and the mother about     further treatment for this patient.  I will follow up on this patient as needed.     Eulogio Ditch, MD     SA/MEDQ  D:  09/17/2010  T:  09/17/2010  Job:  [id number]  Electronically Signed by Eulogio Ditch  on 09/18/2010 06:54:36 PM

## 2010-09-19 DIAGNOSIS — F1920 Other psychoactive substance dependence, uncomplicated: Secondary | ICD-10-CM

## 2010-09-19 DIAGNOSIS — F339 Major depressive disorder, recurrent, unspecified: Secondary | ICD-10-CM

## 2010-09-19 NOTE — Discharge Summary (Addendum)
NAME:  Sean Ruiz, Sean Ruiz NO.:  [id number]  MEDICAL RECORD NO.:  [id number]           PATIENT TYPE:  I  LOCATION:  4704                         FACILITY:  [facility]  PHYSICIAN:  Nelda Bucks, MD DATE OF BIRTH:  21-Nov-1990  DATE OF ADMISSION:  09/11/2010 DATE OF DISCHARGE:  09/18/2010                              DISCHARGE SUMMARY   DATE OF DISCHARGE:  September 18, 2010 to [facility]  DISCHARGE DIAGNOSES: 1. Overdose. 2. Acute respiratory failure with aspiration pneumonia secondary to     overdose. 3. Septic shock secondary to aspiration pneumonia. 4. Rhabdomyolysis with resultant renal failure. 5. Bradycardia. 6. Depression. 7. Hepatic dysfunction. 8. Thrombocytopenia.  CONSULTING PHYSICIANS: 1. Cecille Aver, MD of [facility]. 2. Eulogio Ditch, MD from Psychiatry.  LINES/TUBES: 1. Oral endotracheal tube from April 10 to April 11. 2. The patient required reintubation 4-1/2 hours after extubation on     the 11th and was extubated on the 14th. 3. Right IJ central line from April 10 to April 15. 4. Right femoral Aline from April 10 to April 14.  MICRO DATA: 1. April 10, the urine culture demonstrates no growth.  April 10,     respiratory culture demonstrates normal flora.  April 12. BAL     demonstrates Klebsiella pneumoniae, resistant to ampicillin,     otherwise sensitive. 2. Blood cultures x2 on April 10 demonstrates no growth. 3. April 12, HIV is nonreactive. 4. April 12, acute hepatitis panel is negative.  ANTIBIOTIC DATA:  The patient was placed on Unasyn from April 10 to April 13, Zosyn from April 13 to April 15, vancomycin from April 13 to April 14, ceftriaxone from April 15 to April 16, at which time he was transitioned to Augmentin with an anticipated stop date on April 18.  KEY EVENTS/STUDIES: 1. April 10, CT of the head demonstrates no definite acute     intracranial findings, possible low-lying  cerebellar tonsils versus     Chiari I malformation.  Also noted on April 10, the patient     required pressure control ventilation and high levels of Neo-     Synephrine. 2. April 11, the patient was extubated and tolerated well for     approximately 4 hours, however, his mental status progressed to     somnolence requiring reintubation secondary to hypercapnia with     question of cord edema noted on reintubation versus obstruction     secondary to obstructive sleep apnea. 3. April 11, CT of the head demonstrates no findings, no edema. 4. April 12 broncho demonstrates hyperemic airways no significant     plugging with thick secretions. 5. April 12, lower extremity ultrasound demonstrates negative     preliminary findings for DVT. 6. April 13, the patient did not tolerate weaning, was noted to have     positive balance and hypoxia. 7. April 13, abdominal ultrasound with no significant findings. 8. April 14, the patient was extubated.  HISTORY OF PRESENT ILLNESS:  Sean Ruiz is a 20 year old white male with a past medical history of depression, previously on Prozac, Wellbutrin and clonazepam, who was  transported to the [facility] Emergency Department via EMS on the morning of April 10 after being found by his mother at approximately 8:00 a.m., ashen and unresponsive.  She had responded to his room secondary to his alarm clock going off without cessation.  At the time of admission, his mother indicated that he may have taken some oxymorphone versus OxyContin as relayed to her by one of his friend.  En route to [facility], he was given Narcan x2 with good response, however, on arrival to the emergency department, he was noted to not be able to protect his airway and required intubation.  INITIAL LABORATORY DATA:  Suggested acute renal failure with a creatinine of 2.4 and potassium 6.9.  He also was noted to have sinus tachycardia and a chest x-ray with a left lower lobe  infiltrate. Initially in the emergency department, he was stable, however, did decompensate with hypotension and required placement of central line and early goal-directed therapy with the sepsis protocol.  He was placed on a Levophed drip to achieve a goal MAP of 65 or above.  He was admitted to the intensive care unit on full mechanical support, treated with IV antibiotics for aspiration pneumonia and Nephrology was consulted for acute renal failure.  He was pancultured during hospital course.  Noted BAL positive for Klebsiella pneumoniae.  Please see above culture data for results.  Sean Ruiz was extubated on the morning of April 11 and apparently did well post extubation; however, later in the afternoon, he did become somnolent and had brief respiratory arrest with loss of pulse.  He did receive approximately 2 minutes of CPR and was emergently reintubated with return of spontaneous circulation.  He was maintained on mechanical ventilation after second intubation from April 11 until April 14, at which time he was successfully liberated from the ventilator.  Upon emergent intubation, there was noted some cord edema.  He did have a second CT of the head at that time, which demonstrated no acute findings.  Prior to hospitalization, Mr. Diantonio does have a strong history of snoring and there was concern that second reintubation possibly could have been related to obstructive sleep apnea given a short-base neck and history of sleep disorders and strong snoring history.  He did have rapid resolution of sepsis during hospital course and pressors were weaned off on April 11.  His CPKs peaked on April 11 and then continued to trend down post April 11.  During hospital course, he was transiently placed on a bicarbonate drip under direction of Nephrology secondary to mixed acid-base disorder with metabolic acidosis and respiratory acidosis in the setting of acute tubular necrosis secondary to  rhabdomyolysis.  He also was noted to have his hepatic dysfunction, which was thought likely to be related to shock liver.  He did have a slight rise in his LFTs with a normal bilirubin.  He was placed on a proton pump inhibitor and was maintained nutritionally during hospital course with enteral tube feeding.  Acute hepatitis panel, HIV panel were both negative and abdominal ultrasound was also negative.  He also was noted to have thrombocytopenia in the setting of sepsis during hospital course.  Lower extremity Dopplers were negative for DVT.  During hospitalization, Mr. Hamman was noted to have significant bradycardia with heart rate in the 50s.  This was felt secondary to benzodiazepine overdose, however, he was evaluated with a 2- D echo which demonstrated a normal left ventricle, normal systolic function with an estimated ejection fraction  in the range of 50-55%.  He did note mildly dilated right ventricle, which was thought possibly secondary to ARDS and positive pressure ventilation, however, early obstructive sleep apnea needs to be ruled out.  TSH was also assessed, which was 5.046, thought not likely cause of age of bradycardia. Lyme disease antibody was not detected.  At the time of discharge dictation, Mr. Thoma was then transitioned from IV antibiotics to p.o. antibiotics and will continue on Augmentin until the evening of April 18.  Post extubation, Mr. Menning was evaluated by Psychiatry.  At this time, it is felt that referral to Inpatient Behavioral Health is warranted.  This is unclear if this was an intentional suicide attempt.  The patient reports he took a handful of pills approximately 10 on the evening in question. He indicates that he has thought about suicide in the past and has doubt with depression, but was not intentionally trying to kill himself. However, he has been somewhat flat and closed off during assessment.  HOSPITAL COURSE BY DISCHARGE DIAGNOSES: 1.  Acute overdose, likely benzodiazepine.  Again, this is unclear if     this was an intentional overdose, however, the patient does admit     taking approximately 10 pills on the night in question as well as     heavy drinking up to 1 liter of whiskey per day.  He also indicates     that he buys narcotics occasionally from the street.  Please see     above details of hospital admission for events concerning overdose. 2. Acute respiratory failure with aspiration pneumonia.  Positive for     Klebsiella pneumoniae from BAL.  As per HPI, Mr. Catala     unfortunately did have an aspiration pneumonia and was treated for     such with IV antibiotics.  Please see above antibiotic data for     details.  Post initial extubation, he did have somnolence requiring     reintubation on the evening of April 11 post respiratory arrest     with transient loss of pulse and approximately 2 minutes of CPR.     Upon intubation, he did have noted vocal cord edema; however, there     was great concern that obstructive sleep apnea could have been     contributing factor for reintubation.  Please see discharge     instruction for sleep study followup.  Also noted on 2-D echo, he     did have a dilated right ventricle.  A 2-D echo in followup will be     completed as per discharge instructions. 3. Septic shock secondary to aspiration pneumonia.  Mr. Quillin was  initially admitted on April 10 with acute overdose and aspiration     pneumonia.  Initially, he was hemodynamically stable; however,     decompensated in the emergency department requiring placement of     central line and aggressive early goal-directed therapy via sepsis     protocol.  He responded well and required vasoactive agents for     blood pressure assistance for approximately 24-48 hours.  He did     have culture positive from BAL for Klebsiella pneumoniae.  Please     see above sensitivities. 4. Rhabdomyolysis with a resultant renal failure.  This is  in the     setting of septic shock and acute overdose.  Nephrology was     consulted during hospital course.  CPK initially peaked on the 11th  and trended down post April 11.  Current creatinine is 0.85. 5. Bradycardia.  This was thought likely secondary to benzodiazepine     overdose.  He was evaluated with a TSH, a Lyme disease titer and     all of which were within normal limits. Bradycardia is likely     secondary to benzodiazepine overdose. 6. Depression.  Mr. Covello was evaluated post extubation by Dr.     Rogers Blocker from [facility].  It is recommended at this time     that he continue therapy as an inpatient at [facility]. 7. Hepatic dysfunction.  This is in the setting of shock.  LFTs have     returned to normal limits.  Please see above history of present     illness for further details. 8. Thrombocytopenia in the setting of septic shock.  CBC from April 15     demonstrates a platelet count 115.  DISCHARGE INSTRUCTIONS: 1. Activity.  Activity as tolerated per recommendations of physical     therapy and occupational therapy. 2. Diet.  No restrictions. 3. Followup appointment.  He is scheduled to follow up for a 2-D echo     at [facility] on April 30 at 3:00 p.m. 4. He is scheduled for a sleep study on May 1 at 8:00 p.m. and     followup Pulmonary appointment with Dr. Vassie Loll on May 10 at 11:00     a.m.  DISCHARGE MEDICATIONS: 1. Amoxicillin 875 mg by mouth twice daily.  Next dose is due, the     evening of April 17. 2. Doxycycline 100 mg by mouth twice daily for acne.  This does not     have to be continued while inpatient at [facility]. This can     be re-resumed as an outpatient.  The patient has been instructed to stop taking the following medications: 1. Prozac 40 mg by mouth daily. 2. Wellbutrin XL 1 tablet by mouth daily, 300 mg. 3. Clonazepam 0.5 mg 1-2 tablets by mouth daily at bedtime as needed.     These can be  resumed per instructions of Inpatient Psychiatry.  DISPOSITION AT TIME OF DISCHARGE:  Mr. Jaskiewicz has met maximum benefit of inpatient therapy and is currently medically stable and cleared for discharge pending followup as above.  TIME SPENT ON DISPOSITION:  Greater than 45 minutes.     Canary Brim, NP   ______________________________ Nelda Bucks, MD    BO/MEDQ  D:  09/18/2010  T:  09/18/2010  Job:  [id number]  cc:   Eulogio Ditch, MD Oretha Milch, MD Cecille Aver, M.D.  Electronically Signed by Rory Percy MD on 09/19/2010 01:27:15 PM Electronically Signed by Canary Brim  on 10/05/2010 02:21:19 PM

## 2010-09-20 NOTE — H&P (Addendum)
NAME:  Sean Ruiz, Sean Ruiz NO.:  [id number]  MEDICAL RECORD NO.:  [id number]           PATIENT TYPE:  I  LOCATION:  0502                          FACILITY:  [facility]  PHYSICIAN:  Franchot Gallo, MD     DATE OF BIRTH:  Dec 12, 1990  DATE OF ADMISSION:  09/18/2010 DATE OF DISCHARGE:                      PSYCHIATRIC ADMISSION ASSESSMENT   CHIEF COMPLAINT:  "Overdosed on Klonopin."  HISTORY OF PRESENT ILLNESS:  The patient is a 20 year old single white male who was admitted to [facility] as a transfer from [facility] after the patient voluntarily overdosed on the medication of Klonopin as well as an unknown amount of alcohol.  On interview the patient states that he is unsure how much medication he took, but thinks he may have taken as much as 10 mgs of Klonopin as well as an unknown amount of alcohol.  He reports that he was not attempting to harm himself, but was taking the medication to help with sleep and anxiety.  The patient was admitted to the ICU and at one point was intubated secondary to his overdose.  Currently, the patient states that he has been depressed for at least the last 4 years and was taking the medications Prozac at 40 mg p.o. every morning as well as Wellbutrin XL 300 mg.  However, the patient recently discontinued the Wellbutrin XL, but does not feel that this contributed to his depression.  Currently, the patient reports severe feelings of sadness, anhedonia and depressed mood as well as feelings of helplessness and hopelessness. He reports difficulty initiating and maintaining sleep as well as some appetite disturbances.  He, however, denies any current suicidal or homicidal ideations normal does he report any past suicide attempts.  The patient denies any past auditory or visual hallucinations or delusional thinking.  Nor does he report any past manic or hypomanic symptoms.  The patient does report that he has been  abusing alcohol on a regular basis for several years.  He states that he drinks a minimum of 3 liters of "liquor" per week with possible greater intake over the past week. He also reports to abusing "pain medications" at least once per month stating that he takes opioids.  He also reports to smoking 1/2 pack of cigarettes per day.  Past psychiatric history:  The patient reports that he has been depressed for the last 4 years but denies any psychiatric hospitalizations.  He also reports to taking the medication Ambien in the past as well as Seroquel for sleep.  He did not find the medications helpful.  Past medical history:    Current medications: 1. Prozac 40 mg orally every morning. 2. Klonopin 1 mg orally at bedtime.  Allergies - NKDA.  Medical Illnesses:  None Reported  Past operations:  None Reported.  Family history:  The patient states that his father is 59 years of age and is in good health.  He reports that his mother is 45 years of age and is also in good health.  He reports to having one brother 36 yearsof age who is also in good health.  He states that  there is much depression "on his father's side."  He reports also a maternal uncle who abuses alcohol.  Social history:  The patient states that he was born and raised in the [place] area and lives at home with his parents and his brother.  He states that he is returning to school to complete his senior year.  He currently works at the [employer]  Mental status:  The patient was somewhat drowsy but oriented x3.  Speech was appropriate in terms of volume but was slightly slowed.  Mood appeared moderate to severely depressed.  Affect was essentially flat. The patient denied any auditory of visual hallucinations or delusional thinking.  He also denied any suicidal or homicidal ideations. Judgement and insight does appear fair.  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 Generalized anxiety disorder - severe. Polysubstance dependence - including narcotic pain medications, alcohol and nicotine.  Axis II - deferred.  Axis III - 40.  Axis IV - history of polysubstance dependence.  Axis V - GAF at the time of admission approximately 35.  Highest GAF in the past year approximately 60.  Plan:  1. The patient was restarted on the medication Prozac at 40 mg p.o.     every morning to begin to address his depressive symptoms. 2. The patient was started on the medication Neurontin at 400 mg p.o.     three times a day to begin to address his significant anxiety     symptoms. 3. The patient denies any current symptoms of alcohol, narcotic or     tobacco withdrawal therefore prophylaxis 4.  Withdrawal is not     indicated. 4. It was discussed with the patient the possibility of a referral to     an inpatient substance abuse treatment center such as [facility] in     [place] and the patient agreed with this possibility. 5. The patient will continue to be monitored on a daily basis for     evidence of being a danger to self or others as well as daily     monitoring of his psychiatric symptoms.   _______________________________________ Curlene Labrum Readling, MD    RR/MEDQ  D:  09/19/2010  T:  09/19/2010  Job:  [id number]  Electronically Signed by Franchot Gallo MD on 09/20/2010 05:13:14 PM

## 2010-09-24 NOTE — Discharge Summary (Signed)
NAME:  Sean, Ruiz NO.:  [id number]  MEDICAL RECORD NO.:  [id number]           PATIENT TYPE:  I  LOCATION:  0502                          FACILITY:  [facility]  PHYSICIAN:  Franchot Gallo, MD     DATE OF BIRTH:  10-May-1991  DATE OF ADMISSION:  09/18/2010 DATE OF DISCHARGE:  09/21/2010                              DISCHARGE SUMMARY   REASON FOR ADMISSION:  Patient was a transfer from the medical unit after patient overdosed on Klonopin and an unknown amount of alcohol. Patient was admitted for acute respiratory failure with aspiration pneumonia, septic shock secondary to aspiration pneumonia, and rhabdomyolysis with resultant renal failure and thrombocytopenia. Patient was admitted to the ICU and was intubated.  Patient was reporting depression for the past 4 years.  He reports his alcohol intake has been approximately 3 L of liquor per week.  FINAL IMPRESSION:  AXIS I: 1. Major depressive disorder, recurrent, severe. 2. Generalized anxiety disorder, severe. 3. Polysubstance dependence including narcotic pain medications,     alcohol, and nicotine. AXIS II:  Deferred. AXIS III:  Status post overdose. AXIS IV:  A history of polysubstance dependence. AXIS V:  50 to 55.  LABORATORY DATA:  Blood cultures, no growth.  HIV was nonreactive.  CAT scan of the head, no definite acute intracranial findings.  SIGNIFICANT FINDINGS:  This is a somewhat drowsy but oriented young male.  Speech was appropriate in terms of volume but slightly slowed. His mood was depressed.  Affect was essentially flat.  Patient denied any auditory or visual hallucinations nor delusional thinking.  Denied any suicidal or homicidal thoughts.  Patient was admitted to the adult milieu and started on Prozac 40 mg daily to address his depressive symptoms.  He was also started on Neurontin 400 mg t.i.d. to address his anxiety.  He showed no signs of any alcohol, narcotic, or tobacco withdrawal.  We  began to discuss inpatient substance abuse treatment such as [facility] in [place].  Patient still had some problems sleeping. His appetite was improving.  He is rating his depression as moderate. He had no active suicidal thoughts but had a serious overdose on April 10th that required intubation and ICU stay for 6 days.  He was showing no signs of substance withdrawal.  We continued his Prozac, Neurontin. We contacted the patient's mother who had no concerns about patient being discharged home and would provide information.  Suicide prevention was provided.  He denied any suicidal or homicidal thoughts and he was sleeping well.  His appetite was good.  His mild depression was resolving.  He had no suicidal or homicidal thoughts.  No delusional thinking.  His anxiety was under good control.  He was requesting discharge with plans to enter a substance abuse inpatient program which was going to be arranged by his family.  Staff spoke with mother who agreed to discharge him and planned to stay with patient until he entered a program and patient was discharged.  DISCHARGE MEDICATIONS:  Included: 1. Prozac 20 mg two daily. 2. Gabapentin 200 mg two in the morning and three at bedtime. 3. Amoxicillin  1 tablet daily for 5 more days.  FOLLOWUP APPOINTMENTS: 1. [facility] on Tuesday, April 24th. 2. Patient also had medical appointments at [facility] for a     sleep study and [facility].  Mother had received     information from those particular facilities.     Sean Ruiz, N.P.   ______________________________ Franchot Gallo, MD    JO/MEDQ  D:  09/21/2010  T:  09/21/2010  Job:  [id number]  Electronically Signed by Limmie PatriciaP. on 09/22/2010 12:54:33 PM Electronically Signed by Franchot Gallo MD on 09/24/2010 05:23:28 PM

## 2010-09-25 ENCOUNTER — Encounter (HOSPITAL_BASED_OUTPATIENT_CLINIC_OR_DEPARTMENT_OTHER): Payer: BLUE CROSS/BLUE SHIELD

## 2010-09-25 ENCOUNTER — Other Ambulatory Visit (HOSPITAL_COMMUNITY): Payer: BLUE CROSS/BLUE SHIELD | Admitting: Radiology

## 2010-09-26 ENCOUNTER — Institutional Professional Consult (permissible substitution): Payer: BLUE CROSS/BLUE SHIELD | Admitting: Internal Medicine

## 2010-10-01 ENCOUNTER — Other Ambulatory Visit (HOSPITAL_COMMUNITY): Payer: BLUE CROSS/BLUE SHIELD | Admitting: Radiology

## 2010-10-04 NOTE — Consult Note (Signed)
NAME:  Sean Ruiz, BOVEY NO.:  [id number]  MEDICAL RECORD NO.:  [id number]           PATIENT TYPE:  I  LOCATION:  2116                         FACILITY:  [facility]  PHYSICIAN:  Cecille Aver, M.D.DATE OF BIRTH:  1990/10/07  DATE OF CONSULTATION:  09/11/2010 DATE OF DISCHARGE:                                CONSULTATION   REQUESTING PHYSICIAN:  Comer Locket. Vassie Loll, MD  REASON FOR CONSULTATION:  Acute renal failure and hyperkalemia.  HISTORY OF PRESENT ILLNESS:  Sean Ruiz is a 20 year old white male with past medical history significant for depression.  He was found unresponsive by his mother this morning.  Query possible overdose of narcotics and benzo.  EMS was notified initially good response to Narcan, but when in the emergency department, could not protect airway, so was intubated.  Initial labs revealed a creatinine of 2.4, potassium 6.9.  Chest x-ray consistent with a pneumonia.  The patient was given insulin/bicarb/calcium for his potassium, and a repeat is pending.  The patient was then transferred to the intensive care unit where he subsequently has become hypotensive requiring pressor therapy.  Urine output does seem to be present with a recent emptying of 175 out of his Foley catheter bag.  Of note, the patient's father is in Anesthesia at [facility].  PAST MEDICAL HISTORY:  Depression.  MEDICATIONS AS OUTPATIENT:  Prozac, Wellbutrin, clonazepam, and doxycycline for acne  ALLERGIES:  No known drug allergies.  SOCIAL HISTORY:  Per chart is positive for cigarettes and history of drug use, but no IV drug use.  No alcohol as mentioned.  FAMILY HISTORY:  Negative for any disease.  REVIEW OF SYSTEMS:  Unable to be obtained secondary to the patient's intubated state.  PHYSICAL EXAMINATION:  VITAL SIGNS:  Heart rate 129, blood pressure 90/50, respirations of 20.  The patient is currently on Levophed. Oxygen saturation is in the low 90s with  90% on the vent. GENERAL:  The patient is sedated, but following commands per the family. HEENT:  Pupils are equal, round, and reactive to light.  Extraocular motions are intact. NECK:  There is no jugular venous distention. LUNGS:  Anteriorly clear. CARDIOVASCULAR:  Tachycardic without murmur. ABDOMEN:  Decreased bowel sounds, but soft and nontender. EXTREMITIES:  No significant peripheral edema right now.  LABORATORY DATA:  ABG 7.27/44/64 with 90% FiO2.  Procalcitonin is 12.69 indicating a high probability of sepsis.  Lactic acid is 2.9.  CK is 26,204.  Initial potassium 6.9, BUN and creatinine are 21 and 2.25 with bicarb of 20.  Hemoglobin 16.  ASSESSMENT:  A 20 year old white male found down, question sepsis syndrome, overdose with pneumonia, and rhabdomyolysis. Renal:  Rhabdomyolysis-induced acute renal failure, also probable acute tubular necrosis.  He is currently having urine output.  I would like to attempt trial of volume resuscitation/alkalinize the urine to see if can avoid dialysis, which would be in the form of continuous venovenous hemodialysis.  Potassium level will likely tell us what direction to go in.  Family at bedside informed of plan.  Thank for consultation.  We will follow with you.  ______________________________ Cecille Aver, M.D.     KAG/MEDQ  D:  09/11/2010  T:  09/12/2010  Job:  [id number]  Electronically Signed by Annie Sable M.D. on 10/04/2010 01:47:20 PM

## 2010-10-11 ENCOUNTER — Inpatient Hospital Stay: Payer: BLUE CROSS/BLUE SHIELD | Admitting: Pulmonary Disease

## 2010-10-31 ENCOUNTER — Other Ambulatory Visit (HOSPITAL_COMMUNITY): Payer: Self-pay | Admitting: Internal Medicine

## 2010-10-31 ENCOUNTER — Other Ambulatory Visit (HOSPITAL_COMMUNITY): Payer: Self-pay | Admitting: Radiology

## 2010-10-31 DIAGNOSIS — I517 Cardiomegaly: Secondary | ICD-10-CM

## 2010-11-01 ENCOUNTER — Encounter (HOSPITAL_BASED_OUTPATIENT_CLINIC_OR_DEPARTMENT_OTHER): Payer: BC Managed Care – PPO

## 2010-11-22 ENCOUNTER — Telehealth: Payer: Self-pay | Admitting: Internal Medicine

## 2010-11-22 NOTE — Telephone Encounter (Signed)
Called and spoke with pt's mother, Jacki Cones.  Jacki Cones states pt was seen by Dr. Tyson Alias while at [facility] in April 2012.  Jacki Cones states Dr. Tyson Alias recommended pt have a repeat echo and also a sleep study after being d/c'd from the hosp.  Jacki Cones states pt is now in [place] in "drug rehab" and states he would like to go ahead and have sleep study and echo done. Jacki Cones states pt c/o snoring loudly and feeling tired during the day.  Jacki Cones is wanting to know of a Pulmonary Practice in the [place] area that pt could go to. [facility], can you please help me with this.

## 2010-11-23 NOTE — Telephone Encounter (Signed)
I have no idea about the pulmonary practices in [place].  Would suggest looking in phone book for a pulmonary/sleep practice in that area.

## 2010-11-23 NOTE — Telephone Encounter (Signed)
[id number] x1 

## 2010-11-23 NOTE — Telephone Encounter (Signed)
Advised mother that per kc she should establish with a primary md in [place] and let them refer pt to a pulm. Doc--mother verbalized understanding and will start with a primary md

## 2010-11-29 ENCOUNTER — Ambulatory Visit: Payer: BC Managed Care – PPO | Admitting: Internal Medicine

## 2011-02-13 ENCOUNTER — Ambulatory Visit (HOSPITAL_COMMUNITY): Payer: BC Managed Care – PPO | Attending: Internal Medicine | Admitting: Radiology

## 2011-02-13 DIAGNOSIS — R9389 Abnormal findings on diagnostic imaging of other specified body structures: Secondary | ICD-10-CM | POA: Insufficient documentation

## 2011-02-13 DIAGNOSIS — F17200 Nicotine dependence, unspecified, uncomplicated: Secondary | ICD-10-CM | POA: Insufficient documentation

## 2011-02-13 DIAGNOSIS — I369 Nonrheumatic tricuspid valve disorder, unspecified: Secondary | ICD-10-CM

## 2011-02-13 DIAGNOSIS — F1910 Other psychoactive substance abuse, uncomplicated: Secondary | ICD-10-CM | POA: Insufficient documentation

## 2011-02-13 DIAGNOSIS — I517 Cardiomegaly: Secondary | ICD-10-CM

## 2011-02-13 DIAGNOSIS — I079 Rheumatic tricuspid valve disease, unspecified: Secondary | ICD-10-CM | POA: Insufficient documentation

## 2011-02-13 DIAGNOSIS — I4510 Unspecified right bundle-branch block: Secondary | ICD-10-CM | POA: Insufficient documentation

## 2013-01-19 IMAGING — CR DG CHEST 1V PORT
1 series · 1 of 1 positions shown · non-contrast
Comparison: Chest radiograph 09/13/2010

CLINICAL DATA: Respiratory stress

PORTABLE CHEST - 1 VIEW

[AP]
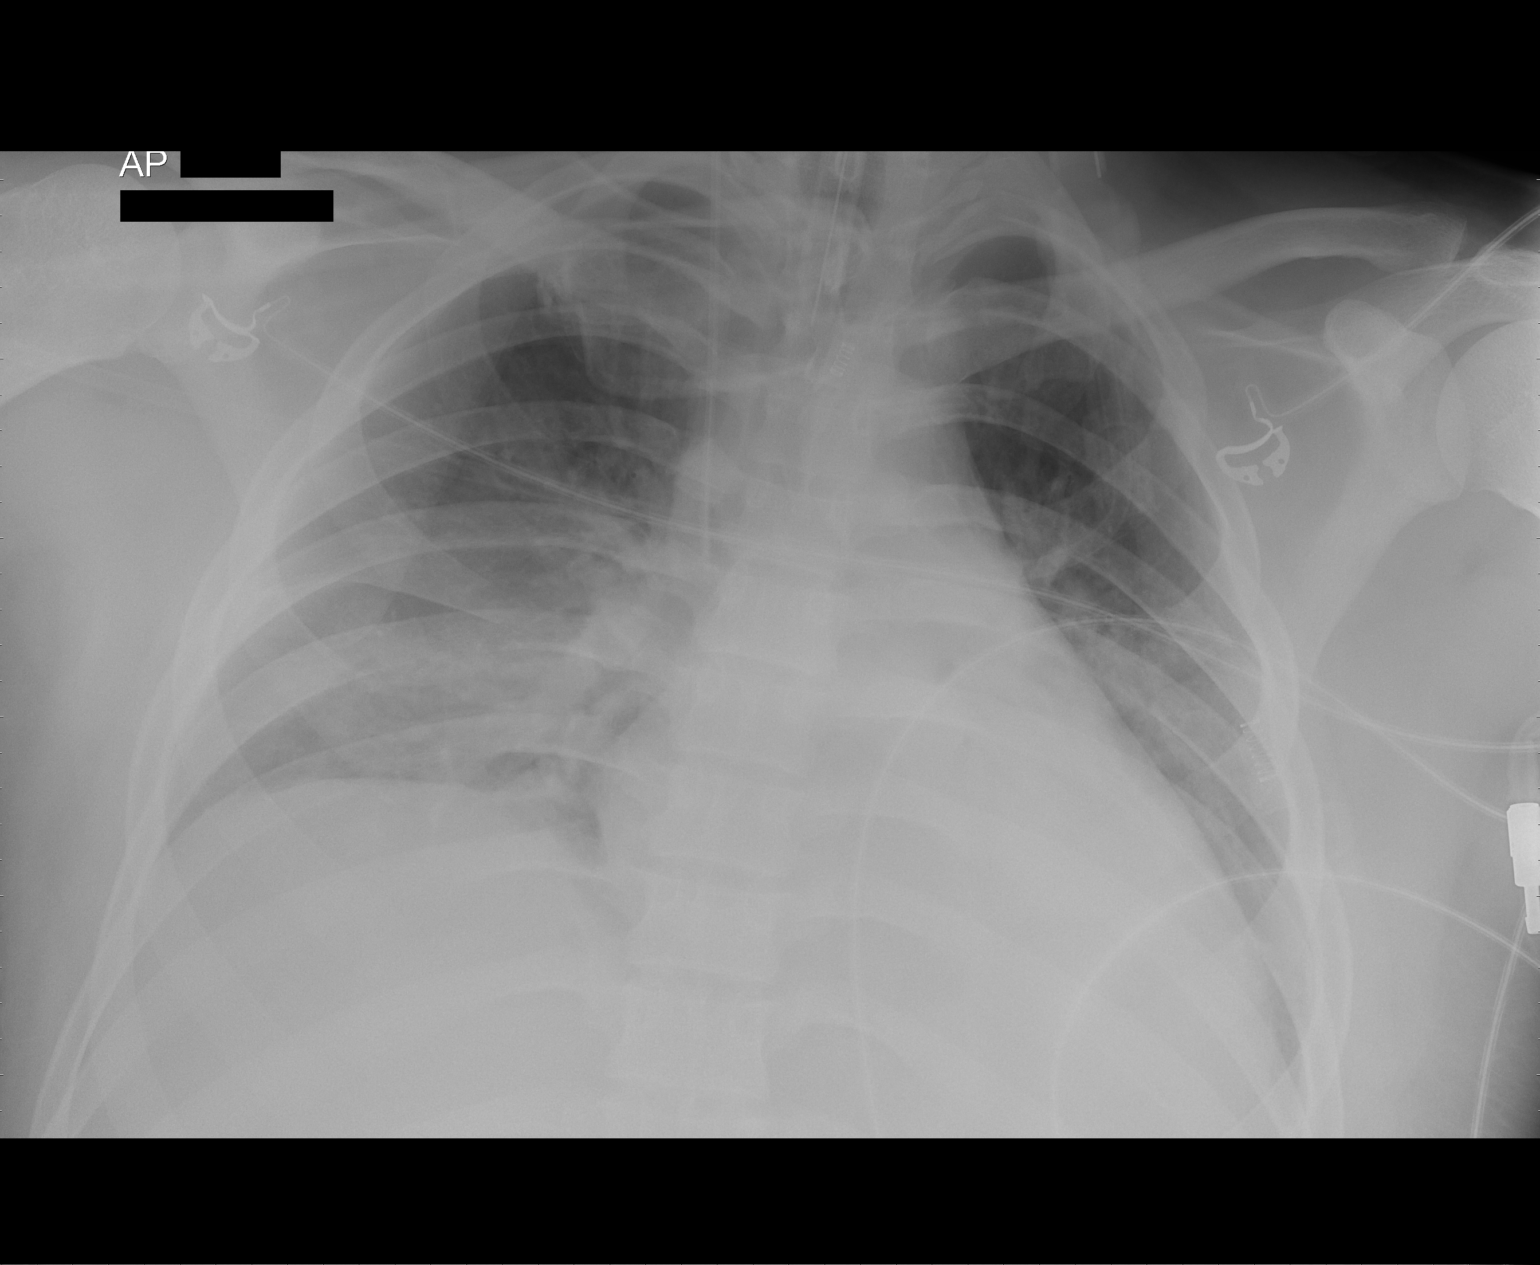

[1 of 1 positions shown; findings below may reference images not displayed]

FINDINGS: Endotracheal tube and right central venous line are
unchanged.  Stable enlarged heart silhouette.  There is bibasilar
air space disease similar to prior.  There is left lower lobe
atelectasis.  No evidence of pneumothorax.
IMPRESSION: 1.  No significant change.
2.  Stable support apparatus.
3.  Bibasilar air space disease.

## 2013-01-19 IMAGING — CR DG CHEST 1V PORT
1 series · 1 of 1 positions shown · non-contrast
Comparison: 3873 hours the same day and earlier.

CLINICAL DATA: 19-year-old male intubated.  Decreased oxygenation,
overdose.

PORTABLE CHEST - 1 VIEW

[AP]
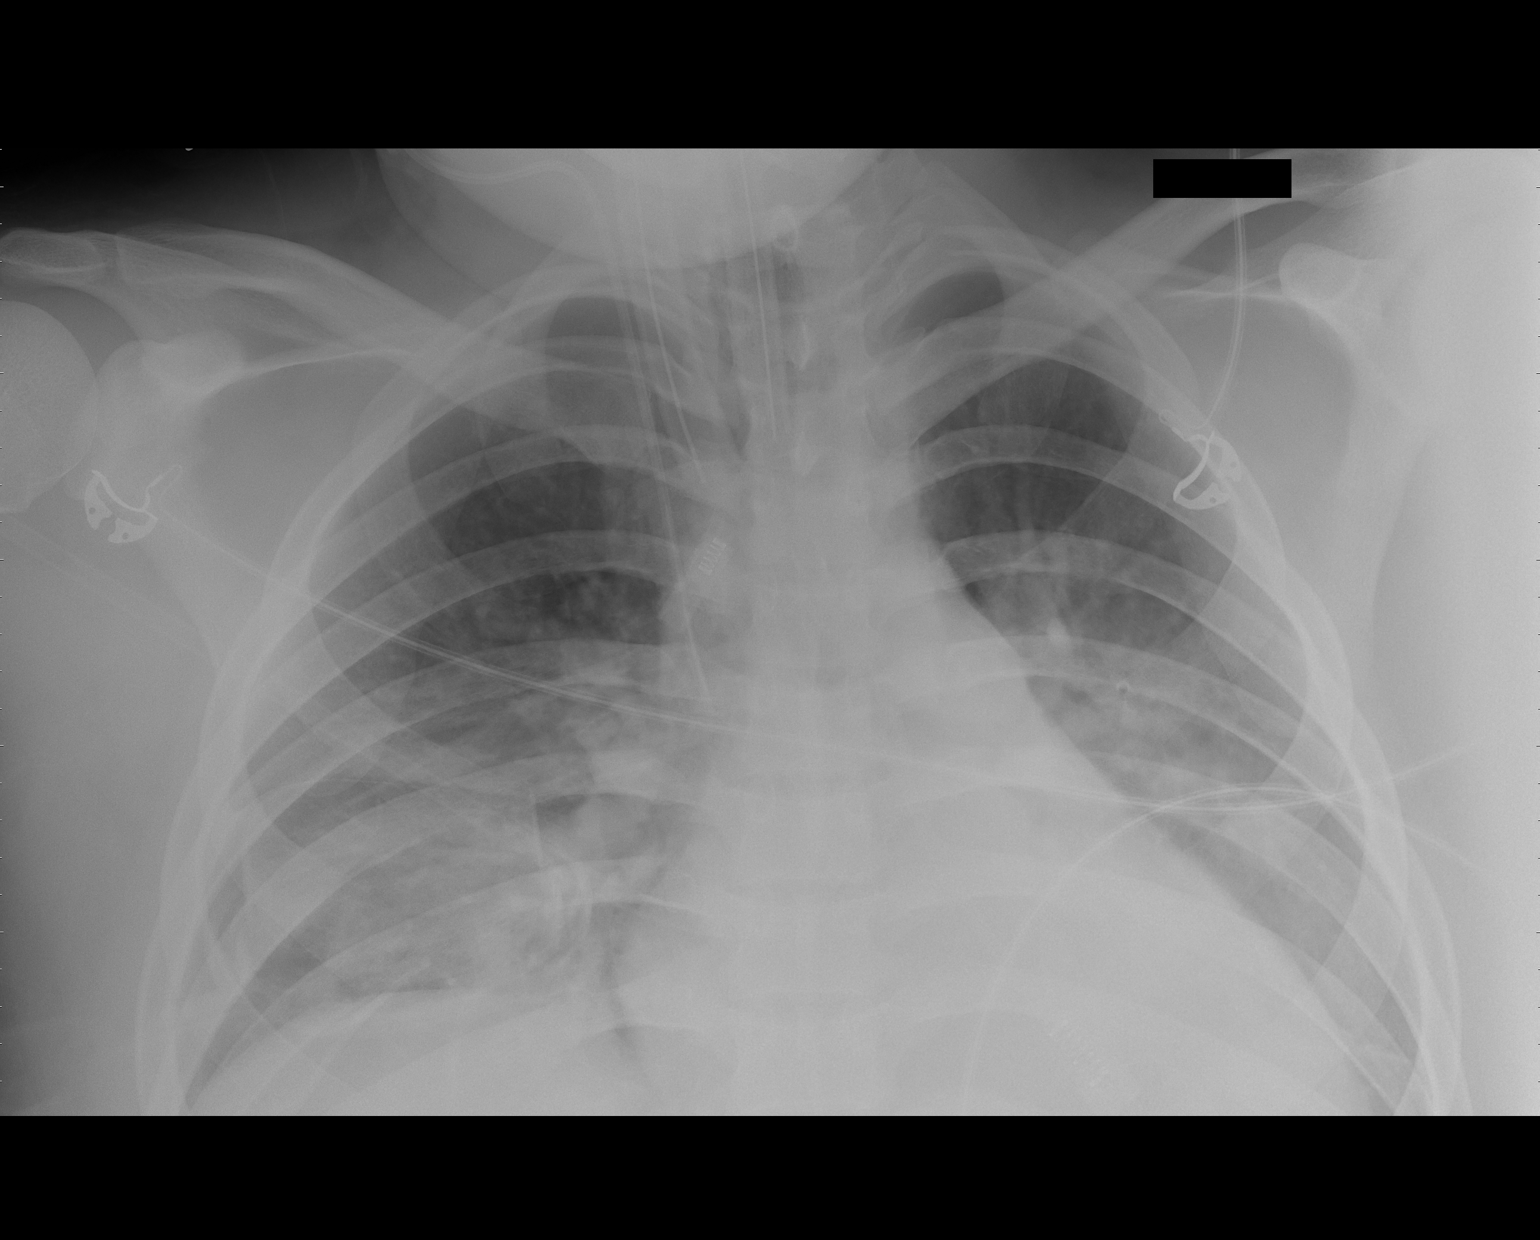

[1 of 1 positions shown; findings below may reference images not displayed]

FINDINGS: AP portable semi upright view 3054 hours.  Endotracheal
tube tip is stable the level of clavicles.  Stable right IJ
catheter.  Better lung volumes.  Continued bilateral lower lobe
confluent atelectasis or consolidation.  Little improvement since
09/13/2010 at 8868 hours.  No pneumothorax or pulmonary edema.
Cardiac size and mediastinal contours are within normal limits.
IMPRESSION: 1. Stable lines and tubes.
2.  Continued bilateral lower lobe and lung base atelectasis or
consolidation with little improvement since 8868 hours yesterday.

## 2013-01-29 ENCOUNTER — Encounter (HOSPITAL_COMMUNITY): Payer: Self-pay | Admitting: Anesthesiology

## 2013-11-22 ENCOUNTER — Ambulatory Visit (INDEPENDENT_AMBULATORY_CARE_PROVIDER_SITE_OTHER): Payer: BC Managed Care – PPO | Admitting: Physician Assistant

## 2013-11-22 VITALS — BP 100/74 | HR 65 | Temp 97.7°F | Resp 18 | Ht 69.0 in | Wt 291.4 lb

## 2013-11-22 DIAGNOSIS — Z7189 Other specified counseling: Secondary | ICD-10-CM

## 2013-11-22 DIAGNOSIS — Z7185 Encounter for immunization safety counseling: Secondary | ICD-10-CM

## 2013-11-22 MED ORDER — ATOVAQUONE-PROGUANIL HCL 250-100 MG PO TABS: 1.0000 | ORAL_TABLET | Freq: Every day | ORAL | Status: AC

## 2013-11-22 NOTE — Progress Notes (Signed)
   Subjective:    Patient ID: Sean Ruiz, male    DOB: 1990-09-21, 23 y.o.   MRN: [id number]  HPI 23 year old male presents for immunization counseling for travel to [place]. He is leaving on 6/27 and will be there for 2 weeks to hike [place]. States he has been to another facility and has already had his typhoid and hep A vaccines.  Was prescribed doxycycline to take for malaria prophylaxis but his father told him he did not think that was the best option secondary to photosensitivity.   He is otherwise healthy with no other concerns today.     Review of Systems  Constitutional: Negative for fever and chills.  Respiratory: Negative for cough.   Gastrointestinal: Negative for nausea and vomiting.  Neurological: Negative for headaches.       Objective:   Physical Exam  Constitutional: He is oriented to person, place, and time. He appears well-developed and well-nourished.  HENT:  Head: Normocephalic and atraumatic.  Right Ear: External ear normal.  Left Ear: External ear normal.  Eyes: Conjunctivae are normal.  Neck: Normal range of motion.  Cardiovascular: Normal rate.   Pulmonary/Chest: Effort normal.  Neurological: He is alert and oriented to person, place, and time.  Psychiatric: He has a normal mood and affect. His behavior is normal. Judgment and thought content normal.          Assessment & Plan:  Immunization counseling - Plan: atovaquone-proguanil (MALARONE) 250-100 MG TABS  Rx for Malarone provided - start 1-2 days prior to travel, take daily while abroad, then continue for 1 week upon return Follow up here as needed.

## 2016-01-02 NOTE — Progress Notes (Signed)
[facility] [address] [place], [place] [address] Phone: [phone] Subjective:     CC: Chest shoulder neck pain  OEH:[id number]  Sean Ruiz is a 25 y.o. male coming in with complaint of shoulder and neck pain. Patient discusses the pain as a dull, throbbing aching sensation. Patient very active and states that sometimes when he moves a certain way he has a sharp pain in the shoulder. Has some pain in the neck. No radiation down the arm. States it is more uncomfortable. Patient denies any true injury. Patient does lift weights and sometimes feels some discomfort. Patient is studying in school and since he has been sitting more often as well has some more pain.     Past Medical History:  Diagnosis Date  . Anxiety   . Depression   . Substance abuse    No past surgical history on file. Social History   Social History  . Marital status: Single    Spouse name: N/A  . Number of children: N/A  . Years of education: N/A   Social History Main Topics  . Smoking status: Former Games developer  . Smokeless tobacco: Never Used  . Alcohol use No     Comment: not since 2012  . Drug use: No     Comment: not since 2012  . Sexual activity: Not Asked   Other Topics Concern  . None   Social History Narrative  . None   Not on File Family History  Problem Relation Age of Onset  . Stroke Maternal Grandmother   . Mental illness Paternal Grandmother     Past medical history, social, surgical and family history all reviewed in electronic medical record.  No pertanent information unless stated regarding to the chief complaint.   Review of Systems: No headache, visual changes, nausea, vomiting, diarrhea, constipation, dizziness, abdominal pain, skin rash, fevers, chills, night sweats, weight loss, swollen lymph nodes, body aches, joint swelling, muscle aches, chest pain, shortness of breath, mood changes.   Objective  Blood pressure 108/78, pulse 60, height 5'  9.75" (1.772 m), weight 243 lb (110.2 kg), SpO2 97 %.  General: No apparent distress alert and oriented x3 mood and affect normal, dressed appropriately.  HEENT: Pupils equal, extraocular movements intact  Respiratory: Patient's speak in full sentences and does not appear short of breath  Cardiovascular: No lower extremity edema, non tender, no erythema  Skin: Warm dry intact with no signs of infection or rash on extremities or on axial skeleton.  Abdomen: Soft nontender  Neuro: Cranial nerves II through XII are intact, neurovascularly intact in all extremities with 2+ DTRs and 2+ pulses.  Lymph: No lymphadenopathy of posterior or anterior cervical chain or axillae bilaterally.  Gait normal with good balance and coordination.  MSK:  Non tender with full range of motion and good stability and symmetric strength and tone of shoulders, elbows, wrist, hip, knee and ankles bilaterally.  Neck: Inspection unremarkable. No palpable stepoffs. Negative Spurling's maneuver. Full neck range of motion Grip strength and sensation normal in bilateral hands Strength good C4 to T1 distribution No sensory change to C4 to T1 Negative Hoffman sign bilaterally Reflexes normal  Shoulder: Bilateral Inspection reveals no abnormalities, atrophy or asymmetry. Palpation is normal with no tenderness over AC joint or bicipital groove. ROM is full in all planes. Rotator cuff strength normal throughout. Mild impingement No labral pathology noted with negative Obrien's, negative clunk and good stability. Normal scapular function observed. No painful  arc and no drop arm sign. No apprehension sign Patient does have retracted shoulders   Osteopathic findings  C2 flexed rotated and side bent right C4 flexed rotated and side bent left T3 extended rotated inside that right T8 extended rotated and side bent left L2 flexed rotated and side bent right Impression and Recommendations:     This case required medical  decision making of moderate complexity.      Note: This dictation was prepared with Dragon dictation along with smaller phrase technology. Any transcriptional errors that result from this process are unintentional.

## 2016-01-03 ENCOUNTER — Ambulatory Visit (INDEPENDENT_AMBULATORY_CARE_PROVIDER_SITE_OTHER): Payer: BLUE CROSS/BLUE SHIELD | Admitting: Family Medicine

## 2016-01-03 ENCOUNTER — Encounter: Payer: Self-pay | Admitting: Family Medicine

## 2016-01-03 DIAGNOSIS — R293 Abnormal posture: Secondary | ICD-10-CM | POA: Diagnosis not present

## 2016-01-03 DIAGNOSIS — M9901 Segmental and somatic dysfunction of cervical region: Secondary | ICD-10-CM | POA: Diagnosis not present

## 2016-01-03 DIAGNOSIS — M9902 Segmental and somatic dysfunction of thoracic region: Secondary | ICD-10-CM

## 2016-01-03 DIAGNOSIS — M9903 Segmental and somatic dysfunction of lumbar region: Secondary | ICD-10-CM

## 2016-01-03 DIAGNOSIS — M999 Biomechanical lesion, unspecified: Secondary | ICD-10-CM | POA: Insufficient documentation

## 2016-01-03 NOTE — Patient Instructions (Signed)
Great to meet you  We have some work to do on the posture Try to keep monitor at eye level Keep tennis ball between shoulder blades with sitting.  On wall with heels, butt shoulder and head touching for a goal of 5 minutes daily  Avoid activity with hands outside peripheral vision.  See me again in 3-4 weeks.

## 2016-01-03 NOTE — Assessment & Plan Note (Signed)
Patient is in poor posture. We discussed icing regimen and home exercises. We discussed which activities to do in which ones to potentially avoid. We discussed avoiding certain activities. Patient will come back and see me again in 3-4 weeks. Responded very well to osteopathic manipulation. Patient continues to have shoulder pain we'll consider further evaluation with ultrasound.

## 2016-01-03 NOTE — Assessment & Plan Note (Signed)
Decision today to treat with OMT was based on Physical Exam  After verbal consent patient was treated with HVLA, ME, FPR techniques in cervical, thoracic and lumbar areas  Patient tolerated the procedure well with improvement in symptoms  Patient given exercises, stretches and lifestyle modifications  See medications in patient instructions if given  Patient will follow up in 3-4 weeks                     

## 2019-09-07 ENCOUNTER — Other Ambulatory Visit: Payer: Self-pay

## 2019-09-07 ENCOUNTER — Encounter (HOSPITAL_COMMUNITY): Payer: Self-pay

## 2019-09-07 ENCOUNTER — Ambulatory Visit (HOSPITAL_COMMUNITY)
Admission: EM | Admit: 2019-09-07 | Discharge: 2019-09-07 | Disposition: A | Discharge status: Home / Self Care | Payer: BLUE CROSS/BLUE SHIELD | Attending: Urgent Care | Admitting: Urgent Care

## 2019-09-07 DIAGNOSIS — L0291 Cutaneous abscess, unspecified: Secondary | ICD-10-CM

## 2019-09-07 DIAGNOSIS — M79632 Pain in left forearm: Secondary | ICD-10-CM

## 2019-09-07 MED ORDER — DOXYCYCLINE HYCLATE 100 MG PO CAPS: 100.0000 mg | ORAL_CAPSULE | Freq: Two times a day (BID) | ORAL | 0 refills | Status: AC

## 2019-09-07 MED ORDER — CELECOXIB 100 MG PO CAPS: 100.0000 mg | ORAL_CAPSULE | Freq: Two times a day (BID) | ORAL | 0 refills | Status: AC

## 2019-09-07 NOTE — ED Triage Notes (Signed)
Pt presents with abscess on top of left wrist X 5 weeks.

## 2019-09-07 NOTE — ED Provider Notes (Signed)
[place]   MRN: [id number] DOB: 04-21-1991  Subjective:   Sean Ruiz is a 29 y.o. male presenting for 1 month history of persistent left forearm swelling, pain and redness.  Patient states that he has had skin infections before but have resolved on their own.  Denies history of MRSA.  No current facility-administered medications for this encounter.  Current Outpatient Medications:  .  atovaquone-proguanil (MALARONE) 250-100 MG TABS, Take 1 tablet by mouth daily., Disp: 23 tablet, Rfl: 0 .  QUEtiapine (SEROQUEL) 100 MG tablet, Take 100 mg by mouth at bedtime., Disp: , Rfl:    Not on File  Past Medical History:  Diagnosis Date  . Anxiety   . Depression   . Substance abuse      No past surgical history on file.  Family History  Problem Relation Age of Onset  . Stroke Maternal Grandmother   . Mental illness Paternal Grandmother     Social History   Tobacco Use  . Smoking status: Former Research scientist (life sciences)  . Smokeless tobacco: Never Used  Substance Use Topics  . Alcohol use: No    Comment: not since 2012  . Drug use: No    Comment: not since 2012    ROS   Objective:   Vitals: BP 114/79 (BP Location: Left Arm)   Pulse 60   Temp 97.9 F (36.6 C) (Oral)   Resp 18   SpO2 99%   Physical Exam Constitutional:      General: He is not in acute distress.    Appearance: Normal appearance. He is well-developed and normal weight. He is not ill-appearing, toxic-appearing or diaphoretic.  HENT:     Head: Normocephalic and atraumatic.     Right Ear: External ear normal.     Left Ear: External ear normal.     Nose: Nose normal.     Mouth/Throat:     Pharynx: Oropharynx is clear.  Eyes:     General: No scleral icterus.       Right eye: No discharge.        Left eye: No discharge.     Extraocular Movements: Extraocular movements intact.     Pupils: Pupils are equal, round, and reactive to light.  Cardiovascular:     Rate and Rhythm: Normal rate.  Pulmonary:       Effort: Pulmonary effort is normal.  Musculoskeletal:       Arms:     Cervical back: Normal range of motion.  Skin:    General: Skin is warm and dry.  Neurological:     Mental Status: He is alert and oriented to person, place, and time.  Psychiatric:        Mood and Affect: Mood normal.        Behavior: Behavior normal.        Thought Content: Thought content normal.        Judgment: Judgment normal.     PROCEDURE NOTE: I&D of Abscess Verbal consent obtained. Local anesthesia with 1cc of 2% lidocaine with epinephrine. Site cleansed with chlorhexidine. Incision of 1cm was made using a 11 blade, discharge of copious amounts of pus and serosanguinous fluid. Wound cavity was explored with Adson forceps and extends about 1 cm in every direction.  Cleansed and dressed.   Assessment and Plan :   1. Abscess   2. Left forearm pain     Successful I&D.  Start doxycycline, wound care reviewed.  Use Celebrex for pain control. Counseled patient on  potential for adverse effects with medications prescribed/recommended today, ER and return-to-clinic precautions discussed, patient verbalized understanding.    Wallis Bamberg, [place] 09/07/19 [phone]
